# Patient Record
Sex: Male | Born: 1995
Health system: Southern US, Community
[De-identification: ages and names within clinical notes are randomized; demographics above are authoritative.]

## PROBLEM LIST (undated history)

## (undated) DIAGNOSIS — J45909 Unspecified asthma, uncomplicated: Secondary | ICD-10-CM

## (undated) DIAGNOSIS — H6690 Otitis media, unspecified, unspecified ear: Secondary | ICD-10-CM

## (undated) HISTORY — PX: WRIST ARTHROSCOPY: SHX838

## (undated) HISTORY — PX: WISDOM TOOTH EXTRACTION: SHX21

---
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
## 2017-07-01 ENCOUNTER — Other Ambulatory Visit: Payer: Self-pay | Admitting: Family Medicine

## 2017-07-01 DIAGNOSIS — N5089 Other specified disorders of the male genital organs: Secondary | ICD-10-CM

## 2017-07-07 ENCOUNTER — Ambulatory Visit
Admission: RE | Admit: 2017-07-07 | Discharge: 2017-07-07 | Disposition: A | Discharge status: Home / Self Care | Payer: 59 | Source: Ambulatory Visit | Attending: Family Medicine | Admitting: Family Medicine

## 2017-07-07 DIAGNOSIS — N5089 Other specified disorders of the male genital organs: Secondary | ICD-10-CM

## 2018-05-03 ENCOUNTER — Encounter (HOSPITAL_COMMUNITY): Payer: Self-pay | Admitting: Emergency Medicine

## 2018-05-03 ENCOUNTER — Ambulatory Visit (HOSPITAL_COMMUNITY)
Admission: EM | Admit: 2018-05-03 | Discharge: 2018-05-03 | Disposition: A | Discharge status: Home / Self Care | Payer: 59 | Attending: Family Medicine | Admitting: Family Medicine

## 2018-05-03 ENCOUNTER — Other Ambulatory Visit: Payer: Self-pay

## 2018-05-03 DIAGNOSIS — G43009 Migraine without aura, not intractable, without status migrainosus: Secondary | ICD-10-CM | POA: Diagnosis not present

## 2018-05-03 DIAGNOSIS — R110 Nausea: Secondary | ICD-10-CM | POA: Diagnosis not present

## 2018-05-03 HISTORY — DX: Unspecified asthma, uncomplicated: J45.909

## 2018-05-03 MED ORDER — DIPHENHYDRAMINE HCL 25 MG PO TABS: 25.0000 mg | ORAL_TABLET | Freq: Four times a day (QID) | ORAL | 0 refills | Status: DC

## 2018-05-03 MED ORDER — ONDANSETRON 4 MG PO TBDP
ORAL_TABLET | ORAL | Status: AC
  Filled 2018-05-03: qty 1

## 2018-05-03 MED ORDER — KETOROLAC TROMETHAMINE 60 MG/2ML IM SOLN
60.0000 mg | Freq: Once | INTRAMUSCULAR | Status: AC
Start: 2018-05-03 — End: 2018-05-03
  Administered 2018-05-03: 60 mg via INTRAMUSCULAR

## 2018-05-03 MED ORDER — ONDANSETRON 4 MG PO TBDP
4.0000 mg | ORAL_TABLET | Freq: Once | ORAL | Status: AC
  Administered 2018-05-03: 4 mg via ORAL

## 2018-05-03 MED ORDER — KETOROLAC TROMETHAMINE 60 MG/2ML IM SOLN
INTRAMUSCULAR | Status: AC
  Filled 2018-05-03: qty 2

## 2018-05-03 NOTE — ED Triage Notes (Signed)
Headache since Monday.  Patient has nausea, arms and legs tingling.  Patient has sensitivity to light.    Patient has a history of headaches

## 2018-05-03 NOTE — ED Provider Notes (Signed)
[facility]    CSN: [id number] Arrival date & time: 05/03/18  1314     History   Chief Complaint Chief Complaint  Patient presents with  . Headache    HPI Robert Galloway is a 22 y.o. male.   Robert Galloway presents with complaints of headache which started 11/11. Has been waxing and waning. Certain movements can make it worse, certain light can worsen it. Has associate nausea and decreased appetite. States he has had a few times of his right arm and leg feeling tingling. Denies this currently. No weakness. No numbness. Left side of head and behind eye throb. Pain currently 4/10. No dizziness. He feels slightly queasy. Has had similar headache in the past but typically doesn't last this long. Has tried ibuprofen and tylenol which haven't helped, last was yesterday. No head injury. No vision loss or change. Without contributing medical history.      ROS per HPI.      Past Medical History:  Diagnosis Date  . Asthma     There are no active problems to display for this patient.   Past Surgical History:  Procedure Laterality Date  . WRIST ARTHROSCOPY Right        Home Medications    Prior to Admission medications   Medication Sig Start Date End Date Taking? Authorizing Provider  fexofenadine (ALLEGRA) 60 MG tablet Take 60 mg by mouth 2 (two) times daily.   Yes [provider]  diphenhydrAMINE (BENADRYL) 25 MG tablet Take 1 tablet (25 mg total) by mouth every 6 (six) hours. 05/03/18   Georgetta HaberBurky, Cristina Ceniceros B, NP    Family History Family History  Problem Relation Age of Onset  . Healthy Mother     Social History Social History   Tobacco Use  . Smoking status: Never Smoker  Substance Use Topics  . Alcohol use: Yes  . Drug use: Yes    Types: Marijuana     Allergies   Augmentin [amoxicillin-pot clavulanate] and Cefzil [cefprozil]   Review of Systems Review of Systems   Physical Exam Triage Vital Signs ED Triage Vitals  Enc Vitals Group   BP 05/03/18 1435 140/78     Pulse Rate 05/03/18 1435 69     Resp 05/03/18 1435 18     Temp 05/03/18 1435 97.9 F (36.6 C)     Temp Source 05/03/18 1435 Oral     SpO2 05/03/18 1435 100 %     Weight --      Height --      Head Circumference --      Peak Flow --      Pain Score 05/03/18 1431 4     Pain Loc --      Pain Edu? --      Excl. in GC? --    No data found.  Updated Vital Signs BP 140/78 (BP Location: Right Arm)   Pulse 69   Temp 97.9 F (36.6 C) (Oral)   Resp 18   SpO2 100%    Physical Exam  Constitutional: He is oriented to person, place, and time. He appears well-developed and well-nourished.  HENT:  Head: Normocephalic and atraumatic.  Eyes: Pupils are equal, round, and reactive to light. EOM are normal.  Neck: Normal range of motion.  Cardiovascular: Normal rate and regular rhythm.  Pulmonary/Chest: Effort normal and breath sounds normal.  Neurological: He is alert and oriented to person, place, and time. He has normal strength. He is not disoriented. No cranial nerve  deficit or sensory deficit. GCS eye subscore is 4. GCS verbal subscore is 5. GCS motor subscore is 6.  Skin: Skin is warm and dry.  Psychiatric: He has a normal mood and affect.     UC Treatments / Results  Labs (all labs ordered are listed, but only abnormal results are displayed) Labs Reviewed - No data to display  EKG None  Radiology No results found.  Procedures Procedures (including critical care time)  Medications Ordered in UC Medications  ketorolac (TORADOL) injection 60 mg (has no administration in time range)  ondansetron (ZOFRAN-ODT) disintegrating tablet 4 mg (has no administration in time range)    Initial Impression / Assessment and Plan / UC Course  I have reviewed the triage vital signs and the nursing notes.  Pertinent labs & imaging results that were available during my care of the patient were reviewed by me and considered in my medical decision making (see  chart for details).     No red flag findings. Normal neurological exam today. Waxing and waning of headache, feels similar to previous headaches. toradol and zofran in clinic today. Encouraged to take benadryl once at home, rest, low stimulus. Return precautions provided. If symptoms worsen or do not improve in the next week to return to be seen or to follow up with PCP.  Patient verbalized understanding and agreeable to plan. Ambulatory out of clinic without difficulty.    Final Clinical Impressions(s) / UC Diagnoses   Final diagnoses:  Migraine without aura and without status migrainosus, not intractable     Discharge Instructions     Go home and take a benadryl and sleep. Low stimulus until improvement of symptoms.  Ensure you drink plenty of water to stay hydrated.  May take additional tylenol as needed. Don't take additional ibuprofen for another 8 hours.  If symptoms worsen or do not improve in the next week to return to be seen or to follow up with your PCP.     ED Prescriptions    Medication Sig Dispense Auth. Provider   diphenhydrAMINE (BENADRYL) 25 MG tablet Take 1 tablet (25 mg total) by mouth every 6 (six) hours. 20 tablet Georgetta Haber, NP     Controlled Substance Prescriptions [place] Controlled Substance Registry consulted? Not Applicable   Georgetta Haber, NP 05/03/18 1511

## 2018-05-03 NOTE — Discharge Instructions (Signed)
Go home and take a benadryl and sleep. Low stimulus until improvement of symptoms.  Ensure you drink plenty of water to stay hydrated.  May take additional tylenol as needed. Don't take additional ibuprofen for another 8 hours.  If symptoms worsen or do not improve in the next week to return to be seen or to follow up with your PCP.

## 2018-09-26 IMAGING — US US SCROTUM W/ DOPPLER COMPLETE
1 series · 14 of 25 positions shown · non-contrast
Comparison: None.

CLINICAL DATA: Testicular lump on the left, 2 months duration.

EXAM:
SCROTAL ULTRASOUND
DOPPLER ULTRASOUND OF THE TESTICLES
TECHNIQUE: Complete ultrasound examination of the testicles, epididymis, and
other scrotal structures was performed. Color and spectral Doppler
ultrasound were also utilized to evaluate blood flow to the
testicles.

[Series 1: us scrotum w/ doppler complete · 0.06mm/px · 14 of 55 slices shown]
[im 1/55]
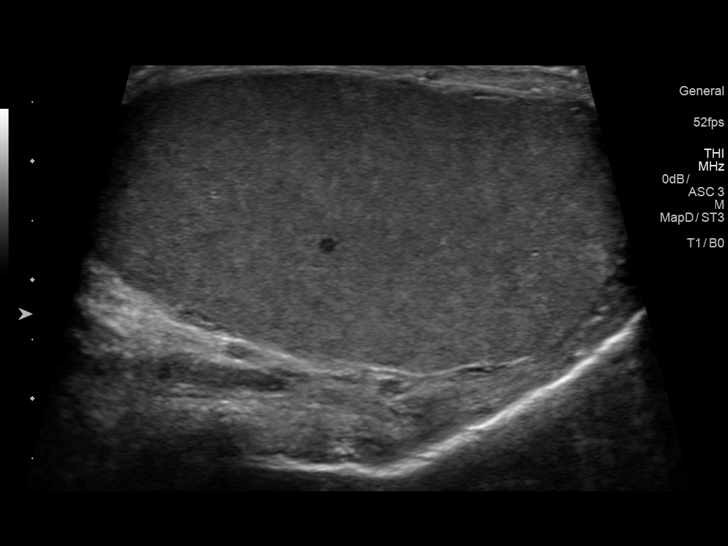
[im 5/55]
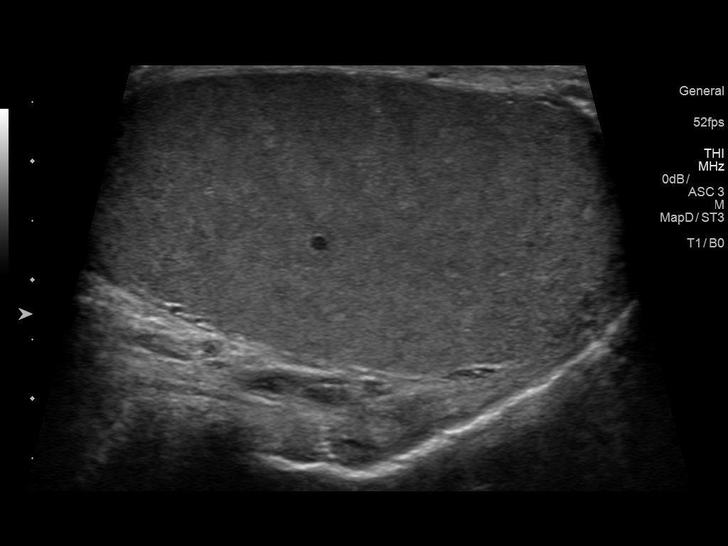
[im 10/55]
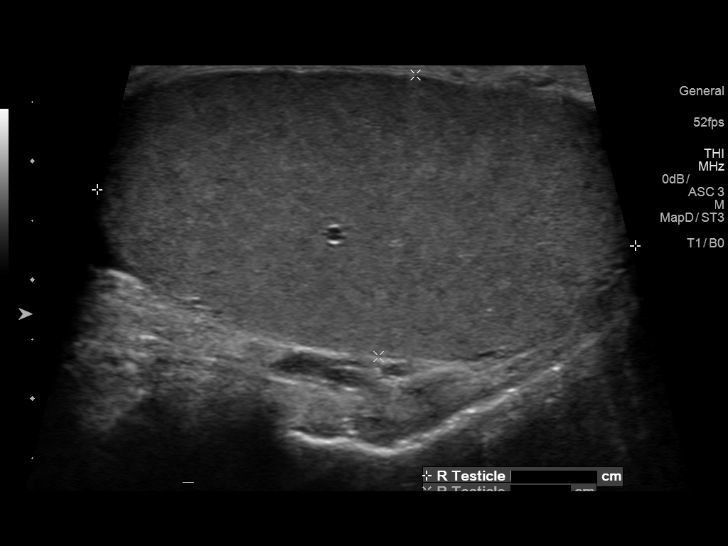
[im 14/55]
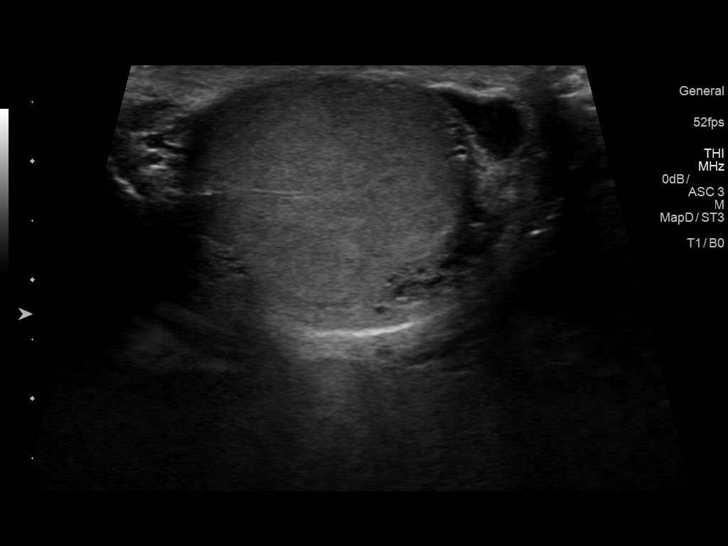
[im 19/55]
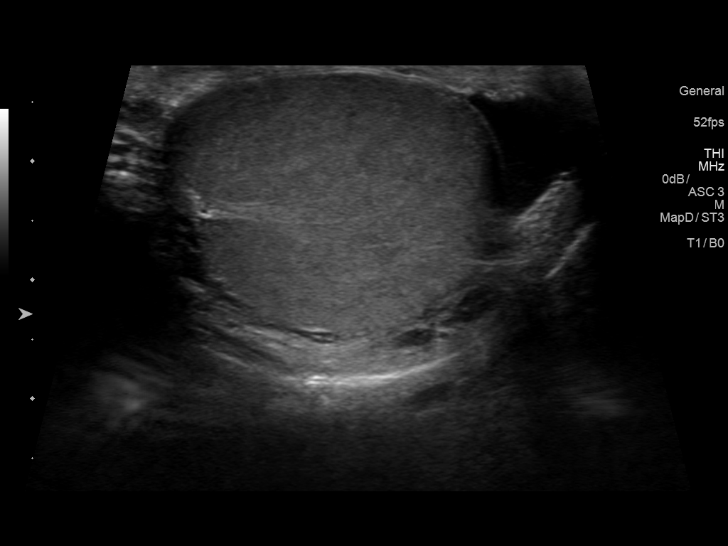
[im 21/55]
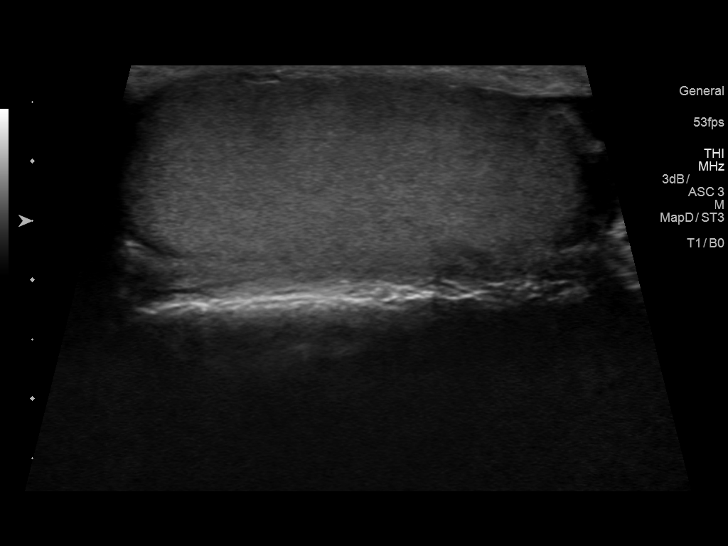
[im 25/55]
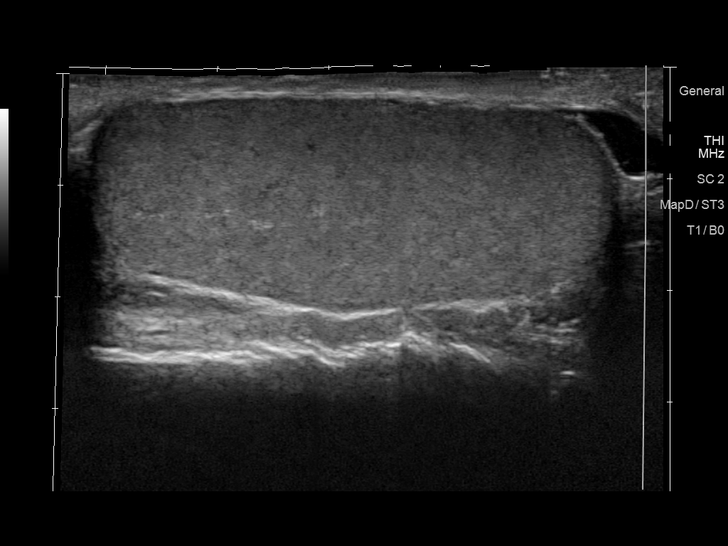
[im 30/55]
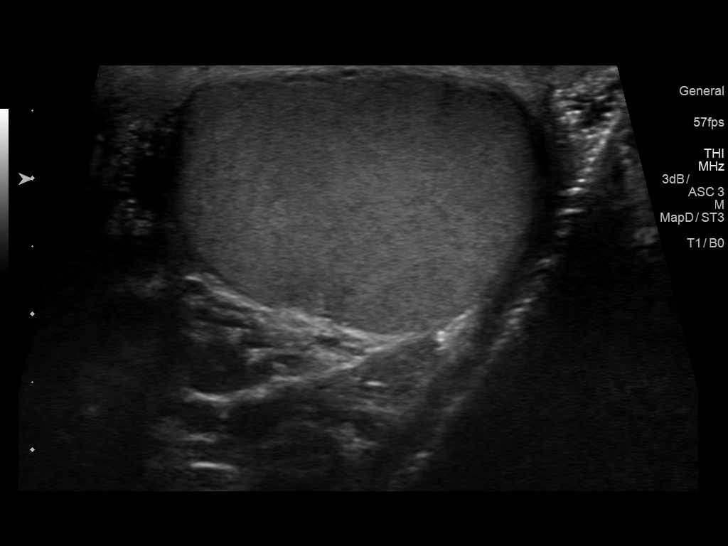
[im 34/55]
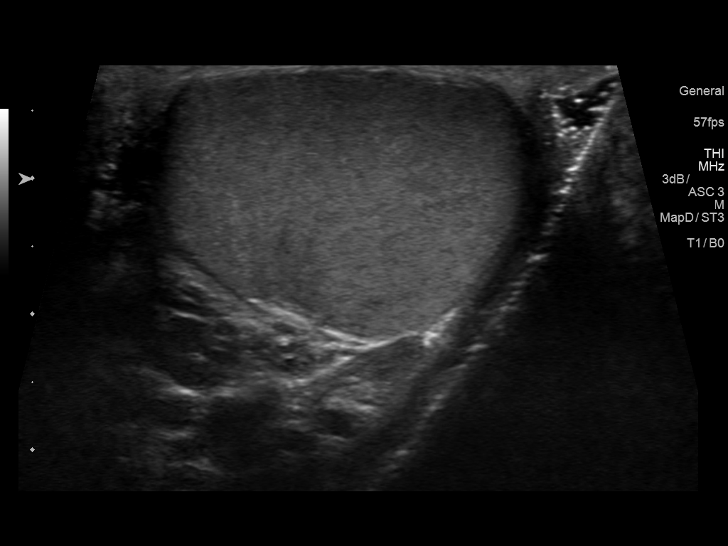
[im 37/55]
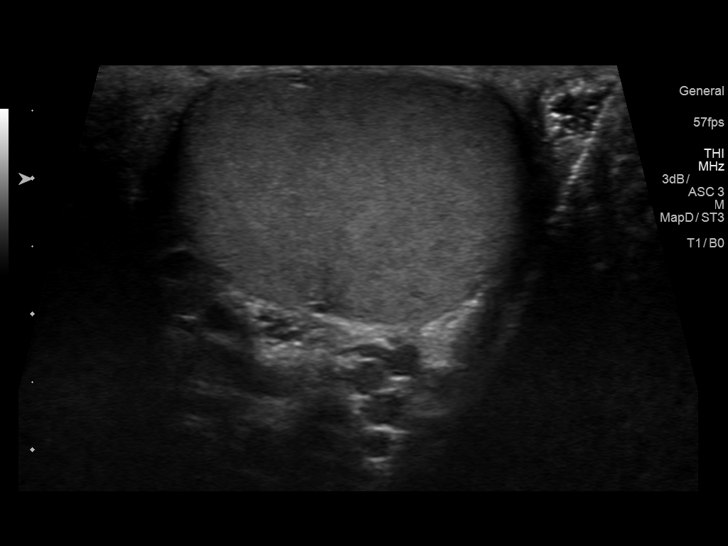
[im 41/55]
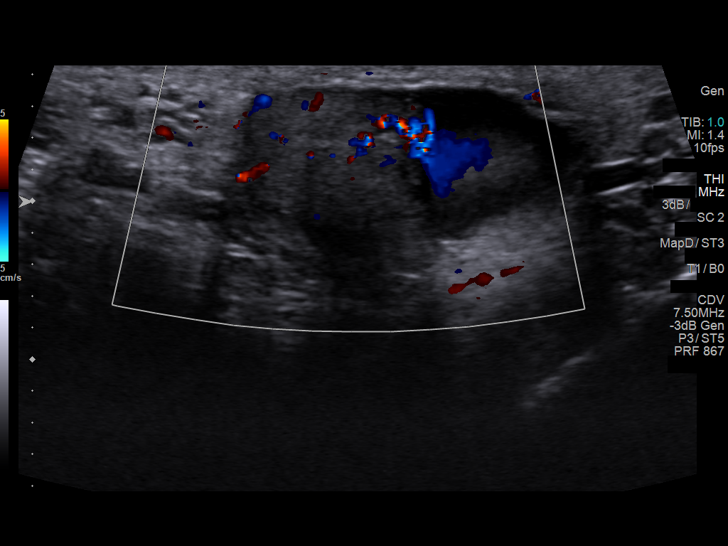
[im 46/55]
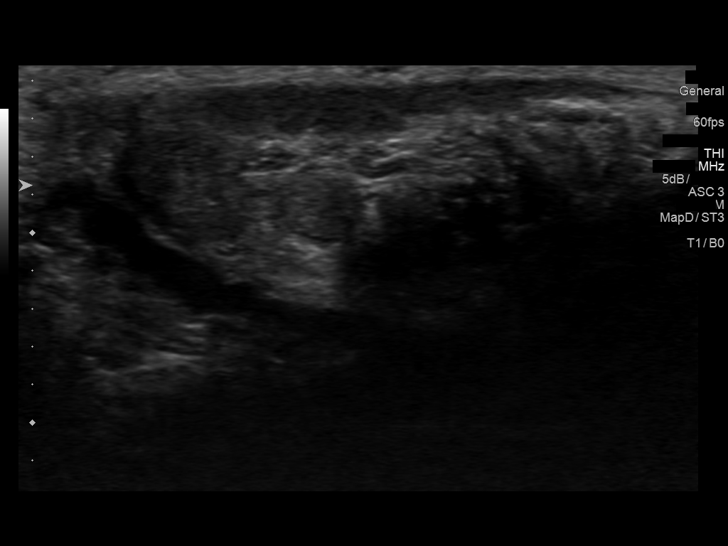
[im 50/55]
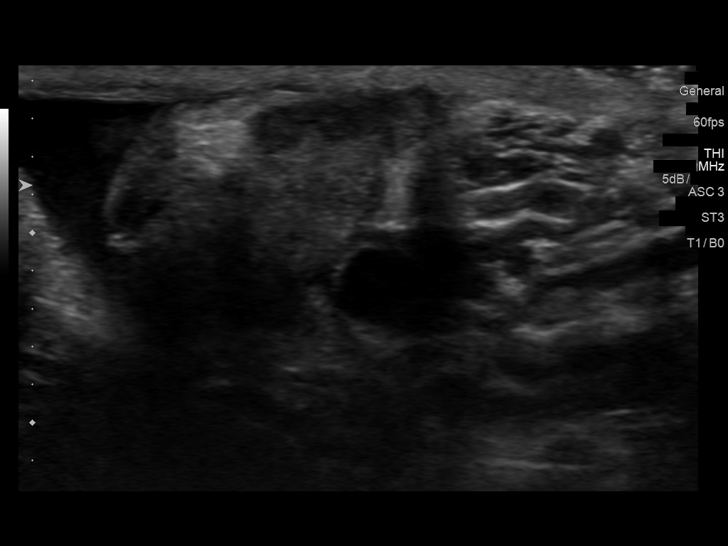
[im 55/55]
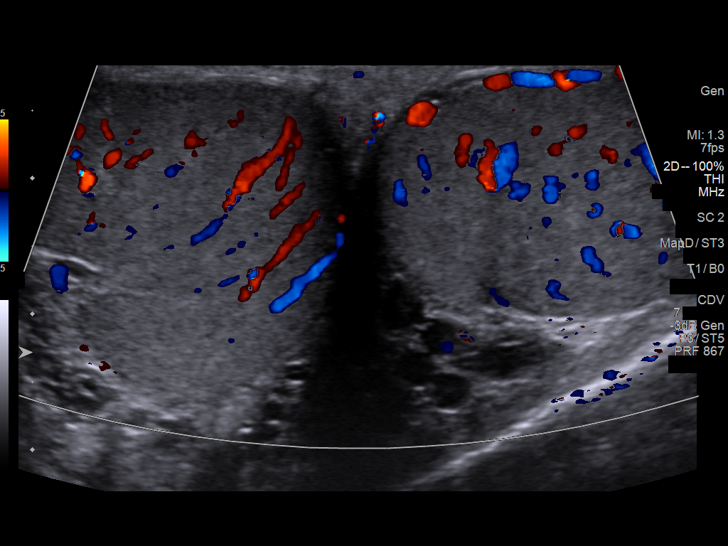

[14 of 25 positions shown; findings below may reference images not displayed]

FINDINGS: Right testicle

Measurements: 4.6 x 2.4 x 2.9 cm.. Normal appearance without mass.
Normal color flow pattern.

Left testicle

Measurements: 4.6 x 2.1 x 2.7 cm. Normal appearance without mass.
Normal color flow pattern.

Right epididymis:  Normal in size and appearance.

Left epididymis:  Normal in size and appearance.

Hydrocele:  Small right hydrocele.

Varicocele: Bilateral varicocele much more extensive on the left.
Perhaps this is what is palpable, as there does not appear to be any
other finding.

Pulsed Doppler interrogation of both testes demonstrates normal low
resistance arterial and venous waveforms bilaterally.
IMPRESSION: No evidence of left testicular mass. Varicocele, more extensive on
the left than the right. This could be what is being felt.

## 2019-05-23 ENCOUNTER — Other Ambulatory Visit: Payer: Self-pay

## 2019-05-23 DIAGNOSIS — Z20822 Contact with and (suspected) exposure to covid-19: Secondary | ICD-10-CM

## 2019-05-26 LAB — NOVEL CORONAVIRUS, NAA: SARS-CoV-2, NAA: NOT DETECTED

## 2019-11-07 ENCOUNTER — Ambulatory Visit: Payer: 59 | Attending: Family

## 2019-11-07 DIAGNOSIS — Z23 Encounter for immunization: Secondary | ICD-10-CM

## 2019-11-07 NOTE — Progress Notes (Signed)
   Covid-19 Vaccination Clinic  Name:  LOWELL MAKARA    MRN: [id number] DOB: [birth year]  11/07/2019  Mr. Freeman was observed post Covid-19 immunization for 15 minutes without incident. He was provided with Vaccine Information Sheet and instruction to access the V-Safe system.   Mr. Kopf was instructed to call 911 with any severe reactions post vaccine: Marland Kitchen Difficulty breathing  . Swelling of face and throat  . A fast heartbeat  . A bad rash all over body  . Dizziness and weakness   Immunizations Administered    Name Date Dose VIS Date Route   Moderna COVID-19 Vaccine 11/07/2019  1:09 PM 0.5 mL 05/2019 Intramuscular   Manufacturer: Moderna   Lot: [id number]   NDC: 79444-619-01

## 2020-03-23 ENCOUNTER — Ambulatory Visit
Admission: RE | Admit: 2020-03-23 | Discharge: 2020-03-23 | Disposition: A | Discharge status: Home / Self Care | Payer: 59 | Source: Ambulatory Visit | Attending: Emergency Medicine | Admitting: Emergency Medicine

## 2020-03-23 ENCOUNTER — Other Ambulatory Visit: Payer: Self-pay

## 2020-03-23 VITALS — BP 155/89 | HR 110 | Temp 100.8°F | Resp 18

## 2020-03-23 DIAGNOSIS — J039 Acute tonsillitis, unspecified: Secondary | ICD-10-CM | POA: Insufficient documentation

## 2020-03-23 LAB — POCT RAPID STREP A (OFFICE): Rapid Strep A Screen: NEGATIVE

## 2020-03-23 MED ORDER — AZITHROMYCIN 250 MG PO TABS: 250.0000 mg | ORAL_TABLET | Freq: Every day | ORAL | 0 refills | Status: DC

## 2020-03-23 NOTE — ED Triage Notes (Signed)
Pt reports sore R tonsil and swollen/tender R lymph node x 1 day.  Fever at home.  Took OTC meds yesterday with some relief.  No meds today.  Slight HA yesterday but states he has hx of headaches.

## 2020-03-23 NOTE — ED Provider Notes (Signed)
[facility]    CSN: [id number] Arrival date & time: 03/23/20  1049      History   Chief Complaint Chief Complaint  Patient presents with  . Sore Throat    HPI Robert Galloway is a 24 y.o. male  Presenting for right-sided sore throat since yesterday.  Took OTC medications without significant relief.  Denies nasal congestion, ear pain, difficulty breathing or swelling, chest pain, palpitations, cough.  No known sick contacts.  Denies abdominal pain, nausea, vomiting, arthralgias, myalgias.  Past Medical History:  Diagnosis Date  . Asthma     There are no problems to display for this patient.   Past Surgical History:  Procedure Laterality Date  . WISDOM TOOTH EXTRACTION    . WRIST ARTHROSCOPY Right        Home Medications    Prior to Admission medications   Medication Sig Start Date End Date Taking? Authorizing Provider  albuterol (VENTOLIN HFA) 108 (90 Base) MCG/ACT inhaler Inhale into the lungs every 6 (six) hours as needed for wheezing or shortness of breath.   Yes [provider]  fexofenadine (ALLEGRA) 60 MG tablet Take 60 mg by mouth 2 (two) times daily.   Yes [provider]  azithromycin (ZITHROMAX) 250 MG tablet Take 1 tablet (250 mg total) by mouth daily. Take first 2 tablets together, then 1 every day until finished. 03/23/20   Hall-Potvin, [place], PA-C  diphenhydrAMINE (BENADRYL) 25 MG tablet Take 1 tablet (25 mg total) by mouth every 6 (six) hours. 05/03/18   Georgetta Haber, NP    Family History Family History  Problem Relation Age of Onset  . Healthy Mother   . Hyperlipidemia Father   . Hypertension Father     Social History Social History   Tobacco Use  . Smoking status: Never Smoker  . Smokeless tobacco: Never Used  Vaping Use  . Vaping Use: Former  Substance Use Topics  . Alcohol use: Yes    Comment: occasional   . Drug use: Yes    Types: Marijuana     Allergies   Augmentin [amoxicillin-pot  clavulanate] and Cefzil [cefprozil]   Review of Systems As pe rHPI   Physical Exam Triage Vital Signs ED Triage Vitals  Enc Vitals Group     BP      Pulse      Resp      Temp      Temp src      SpO2      Weight      Height      Head Circumference      Peak Flow      Pain Score      Pain Loc      Pain Edu?      Excl. in GC?    No data found.  Updated Vital Signs BP (!) 155/89 (BP Location: Left Arm)   Pulse (!) 110   Temp (!) 100.8 F (38.2 C) (Oral)   Resp 18   SpO2 97%   Visual Acuity Right Eye Distance:   Left Eye Distance:   Bilateral Distance:    Right Eye Near:   Left Eye Near:    Bilateral Near:     Physical Exam HENT:     Mouth/Throat:     Tonsils: No tonsillar exudate. 3+ on the right. 1+ on the left.      UC Treatments / Results  Labs (all labs ordered are listed, but only abnormal results are  displayed) Labs Reviewed  NOVEL CORONAVIRUS, NAA  CULTURE, GROUP A STREP [facility])  POCT RAPID STREP A (OFFICE)    EKG   Radiology No results found.  Procedures Procedures (including critical care time)  Medications Ordered in UC Medications - No data to display  Initial Impression / Assessment and Plan / UC Course  I have reviewed the triage vital signs and the nursing notes.  Pertinent labs & imaging results that were available during my care of the patient were reviewed by me and considered in my medical decision making (see chart for details).     Patient febrile, nontoxic.  No airway compromise.  Rapid strep negative, culture pending.  Covid PCR pending will cover for tonsillitis as outlined below: Azithromycin as patient endorses systemic rash to Augmentin.  Return precautions discussed, pt verbalized understanding and is agreeable to plan. Final Clinical Impressions(s) / UC Diagnoses   Final diagnoses:  Acute tonsillitis, unspecified etiology     Discharge Instructions     Your rapid strep test was negative today.  The  culture is pending.  Please look on your MyChart for test results.   We will notify you if the culture positive and outline a treatment plan at that time.   Please continue Tylenol and/or Ibuprofen as needed for fever, pain.  May try warm salt water gargles, cepacol lozenges, throat spray, warm tea or water with lemon/honey, or OTC cold relief medicine for throat discomfort.   For congestion: take a daily anti-histamine like Zyrtec, Claritin, and a oral decongestant to help with post nasal drip that may be irritating your throat.   It is important to stay hydrated: drink plenty of fluids (primarily water) to keep your throat moisturized and help further relieve irritation/discomfort.     ED Prescriptions    Medication Sig Dispense Auth. Provider   azithromycin (ZITHROMAX) 250 MG tablet Take 1 tablet (250 mg total) by mouth daily. Take first 2 tablets together, then 1 every day until finished. 6 tablet Hall-Potvin, [place], PA-C     PDMP not reviewed this encounter.   Hall-Potvin, [place], [place] 03/23/20 1219

## 2020-03-23 NOTE — Discharge Instructions (Addendum)

## 2020-03-25 LAB — NOVEL CORONAVIRUS, NAA: SARS-CoV-2, NAA: NOT DETECTED

## 2020-03-25 LAB — CULTURE, GROUP A STREP (THRC)

## 2020-03-25 LAB — SARS-COV-2, NAA 2 DAY TAT

## 2020-09-06 ENCOUNTER — Encounter: Payer: Self-pay | Admitting: *Deleted

## 2020-09-06 ENCOUNTER — Other Ambulatory Visit: Payer: Self-pay

## 2020-09-06 ENCOUNTER — Ambulatory Visit
Admission: EM | Admit: 2020-09-06 | Discharge: 2020-09-06 | Disposition: A | Discharge status: Home / Self Care | Payer: 59 | Attending: Emergency Medicine | Admitting: Emergency Medicine

## 2020-09-06 DIAGNOSIS — J36 Peritonsillar abscess: Secondary | ICD-10-CM | POA: Diagnosis not present

## 2020-09-06 DIAGNOSIS — R509 Fever, unspecified: Secondary | ICD-10-CM | POA: Insufficient documentation

## 2020-09-06 HISTORY — DX: Otitis media, unspecified, unspecified ear: H66.90

## 2020-09-06 LAB — POCT RAPID STREP A (OFFICE): Rapid Strep A Screen: NEGATIVE

## 2020-09-06 MED ORDER — AZITHROMYCIN 250 MG PO TABS: 500.0000 mg | ORAL_TABLET | Freq: Every day | ORAL | 0 refills | Status: DC

## 2020-09-06 MED ORDER — IBUPROFEN 800 MG PO TABS: 800.0000 mg | ORAL_TABLET | Freq: Three times a day (TID) | ORAL | 0 refills | Status: AC

## 2020-09-06 NOTE — ED Triage Notes (Addendum)
Reports right sided sore throat, ear pain, chills onset yesterday with progressive worsening.  Reports negative Covid test at home today.

## 2020-09-06 NOTE — ED Provider Notes (Signed)
[facility]    CSN: [id number] Arrival date & time: 09/06/20  1109      History   Chief Complaint Chief Complaint  Patient presents with  . Sore Throat  . Otalgia    HPI Robert Galloway is a 25 y.o. male history of asthma presenting today for evaluation of sore throat.  Reports sore throat ear pain and chills beginning yesterday and is progressively worsening.  Negative home Covid test.  Reports sore throat is only right-sided.  Has had subjective fevers.  Denies close sick contacts.  HPI  Past Medical History:  Diagnosis Date  . Asthma   . Otitis media     There are no problems to display for this patient.   Past Surgical History:  Procedure Laterality Date  . WISDOM TOOTH EXTRACTION    . WRIST ARTHROSCOPY Right        Home Medications    Prior to Admission medications   Medication Sig Start Date End Date Taking? Authorizing Provider  azithromycin (ZITHROMAX) 250 MG tablet Take 2 tablets (500 mg total) by mouth daily for 5 days. 09/06/20 09/11/20 Yes Chiyeko Ferre C, PA-C  fexofenadine (ALLEGRA) 60 MG tablet Take 60 mg by mouth 2 (two) times daily.   Yes [provider]  ibuprofen (ADVIL) 800 MG tablet Take 1 tablet (800 mg total) by mouth 3 (three) times daily. 09/06/20  Yes Katria Botts C, PA-C  Triamcinolone Acetonide (NASACORT AQ NA) Place into the nose.   Yes [provider]  UNKNOWN TO PATIENT Vitamins   Yes [provider]  albuterol (VENTOLIN HFA) 108 (90 Base) MCG/ACT inhaler Inhale into the lungs every 6 (six) hours as needed for wheezing or shortness of breath.    [provider]  diphenhydrAMINE (BENADRYL) 25 MG tablet Take 1 tablet (25 mg total) by mouth every 6 (six) hours. 05/03/18 09/06/20  Georgetta Haber, NP    Family History Family History  Problem Relation Age of Onset  . Healthy Mother   . Hyperlipidemia Father   . Hypertension Father     Social History Social History   Tobacco Use   . Smoking status: Never Smoker  . Smokeless tobacco: Never Used  Vaping Use  . Vaping Use: Former  Substance Use Topics  . Alcohol use: Yes    Comment: occasional   . Drug use: Yes    Types: Marijuana     Allergies   Augmentin [amoxicillin-pot clavulanate] and Cefzil [cefprozil]   Review of Systems Review of Systems  Constitutional: Negative for activity change, appetite change, chills, fatigue and fever.  HENT: Positive for rhinorrhea and sore throat. Negative for congestion, ear pain, sinus pressure and trouble swallowing.   Eyes: Negative for discharge and redness.  Respiratory: Negative for cough, chest tightness and shortness of breath.   Cardiovascular: Negative for chest pain.  Gastrointestinal: Negative for abdominal pain, diarrhea, nausea and vomiting.  Musculoskeletal: Negative for myalgias.  Skin: Negative for rash.  Neurological: Negative for dizziness, light-headedness and headaches.     Physical Exam Triage Vital Signs ED Triage Vitals  Enc Vitals Group     BP      Pulse      Resp      Temp      Temp src      SpO2      Weight      Height      Head Circumference      Peak Flow  Pain Score      Pain Loc      Pain Edu?      Excl. in GC?    No data found.  Updated Vital Signs BP 122/75   Pulse (!) 112   Temp 100.2 F (37.9 C) (Oral)   Resp 16   SpO2 98%   Visual Acuity Right Eye Distance:   Left Eye Distance:   Bilateral Distance:    Right Eye Near:   Left Eye Near:    Bilateral Near:     Physical Exam Vitals and nursing note reviewed.  Constitutional:      Appearance: He is well-developed.     Comments: No acute distress  HENT:     Head: Normocephalic and atraumatic.     Ears:     Comments: Bilateral ears without tenderness to palpation of external auricle, tragus and mastoid, EAC's without erythema or swelling, TM's with good bony landmarks and cone of light. Non erythematous.     Nose: Nose normal.     Mouth/Throat:      Comments: Oral mucosa pink and moist, right tonsillar enlargement with erythema, no exudate, uvula midline, no soft palate swelling, posterior pharynx patent and nonerythematous, no uvula deviation or swelling. Normal phonation. Eyes:     Conjunctiva/sclera: Conjunctivae normal.  Cardiovascular:     Rate and Rhythm: Normal rate.  Pulmonary:     Effort: Pulmonary effort is normal. No respiratory distress.     Comments: Breathing comfortably at rest, CTABL, no wheezing, rales or other adventitious sounds auscultated Abdominal:     General: There is no distension.  Musculoskeletal:        General: Normal range of motion.     Cervical back: Neck supple.  Skin:    General: Skin is warm and dry.  Neurological:     Mental Status: He is alert and oriented to person, place, and time.      UC Treatments / Results  Labs (all labs ordered are listed, but only abnormal results are displayed) Labs Reviewed  CULTURE, GROUP A STREP [facility])  POCT RAPID STREP A (OFFICE)    EKG   Radiology No results found.  Procedures Procedures (including critical care time)  Medications Ordered in UC Medications - No data to display  Initial Impression / Assessment and Plan / UC Course  I have reviewed the triage vital signs and the nursing notes.  Pertinent labs & imaging results that were available during my care of the patient were reviewed by me and considered in my medical decision making (see chart for details).     Patient with borderline fever and unilateral tonsillar swelling and pain, no obvious sign of abscess at this time, but opting to go ahead and place on antibiotics.  Has allergy to Augmentin and Cefzil, will defer penicillin/cephalosporins at this time, will proceed with azithromycin, anti-inflammatories for pain and swelling.  Discussed strict return precautions. Patient verbalized understanding and is agreeable with plan.  Final Clinical Impressions(s) / UC Diagnoses   Final  diagnoses:  Peritonsillitis  Fever, unspecified     Discharge Instructions     Tylenol and ibuprofen for pain and swelling/fever Azithromycin 2 tablets daily for 5 days Follow-up if not improving or worsening    ED Prescriptions    Medication Sig Dispense Auth. Provider   ibuprofen (ADVIL) 800 MG tablet Take 1 tablet (800 mg total) by mouth 3 (three) times daily. 21 tablet Heath Badon C, PA-C   azithromycin (ZITHROMAX) 250 MG  tablet Take 2 tablets (500 mg total) by mouth daily for 5 days. 10 tablet Jaimie Redditt, [place] C, PA-C     PDMP not reviewed this encounter.   Lew Dawes, [place] 09/06/20 1206

## 2020-09-06 NOTE — Discharge Instructions (Signed)
Tylenol and ibuprofen for pain and swelling/fever Azithromycin 2 tablets daily for 5 days Follow-up if not improving or worsening

## 2020-09-08 ENCOUNTER — Other Ambulatory Visit: Payer: Self-pay

## 2020-09-08 ENCOUNTER — Ambulatory Visit
Admission: EM | Admit: 2020-09-08 | Discharge: 2020-09-08 | Disposition: A | Discharge status: Home / Self Care | Payer: 59 | Attending: Emergency Medicine | Admitting: Emergency Medicine

## 2020-09-08 ENCOUNTER — Encounter: Payer: Self-pay | Admitting: Emergency Medicine

## 2020-09-08 DIAGNOSIS — J029 Acute pharyngitis, unspecified: Secondary | ICD-10-CM | POA: Diagnosis present

## 2020-09-08 LAB — CULTURE, GROUP A STREP (THRC)

## 2020-09-08 LAB — POCT MONO SCREEN (KUC): Mono, POC: NEGATIVE

## 2020-09-08 MED ORDER — DEXAMETHASONE SODIUM PHOSPHATE 10 MG/ML IJ SOLN
10.0000 mg | Freq: Once | INTRAMUSCULAR | Status: AC
  Administered 2020-09-08: 10 mg via INTRAMUSCULAR

## 2020-09-08 MED ORDER — CLINDAMYCIN HCL 300 MG PO CAPS: 300.0000 mg | ORAL_CAPSULE | Freq: Three times a day (TID) | ORAL | 0 refills | Status: AC

## 2020-09-08 NOTE — Discharge Instructions (Addendum)
Mono negative today.  Stop azithromycin.  Start clindamycin.  Added 1000 mg Tylenol to ibuprofen 3 times a day.  Dexamethasone will work for 3 days.  We will contact you if your HSV culture comes back positive.   Some people find salt water gargles and  Traditional Medic[employer]'s "Throat Coat" tea helpful. Take 5 mL of liquid Benadryl and 5 mL of Maalox. Mix it together, and then hold it in your mouth for as long as you can and then swallow. You may do this 4 times a day.    Go to www.goodrx.com to look up your medications. This will give you a list of where you can find your prescriptions at the most affordable prices. Or ask the pharmacist what the cash price is, or if they have any other discount programs available to help make your medication more affordable. This can be less expensive than what you would pay with insurance.

## 2020-09-08 NOTE — ED Triage Notes (Addendum)
Patient c/o sore throat, fever, headache, and bilateral ear pain x 5 days.   Patient states symptoms haven't improved since office visit on Sunday.   Patient endorses chills at home.   Patient was seen at this clinic on Sunday and reports culture results that came on Mychart today.   Patient was given azithromycin and ibuprofen with no relief of symptoms.

## 2020-09-08 NOTE — ED Provider Notes (Signed)
HPI  SUBJECTIVE:  Robert Galloway is a 25 y.o. male who presents with sore throat for 4 days.  States initially started on the right side and now is located on the left.  He reports bilateral exudates, fevers T-max 100.2, chills, sensation of his throat swelling shut, body aches, headaches, ear pain.  No muffled voice, neck stiffness, drooling, trismus, difficulty breathing.  No loss of sense of smell or taste, cough.  He reports nausea starting after taking azithromycin.  No vomiting, diarrhea, cough.  No rash anywhere.  He had a negative home Covid test 2 days ago.  No known Covid exposure.  He got the booster vaccine recently.  He took ibuprofen within 6 hours of evaluation.  He has tried ibuprofen, azithromycin.  Ibuprofen helps.  Symptoms are worse with eating.  Patient was seen here 2 days ago for right-sided sore throat, thought to have a peritonsillitis.  Was placed on azithromycin because he has allergies to Augmentin and Cefzil.  Sent home on NSAIDs. Throat culture grew out beta-hemolytic  strep, not group A.  He has a past medical history of asthma, COVID, recurrent otitis media.  No history of frequent strep, mono, HSV 1 and 2.  [id number], Clifton Custard, MD   Past Medical History:  Diagnosis Date  . Asthma   . Otitis media     Past Surgical History:  Procedure Laterality Date  . WISDOM TOOTH EXTRACTION    . WRIST ARTHROSCOPY Right     Family History  Problem Relation Age of Onset  . Healthy Mother   . Hyperlipidemia Father   . Hypertension Father     Social History   Tobacco Use  . Smoking status: Never Smoker  . Smokeless tobacco: Never Used  Vaping Use  . Vaping Use: Former  Substance Use Topics  . Alcohol use: Yes    Comment: occasional   . Drug use: Yes    Types: Marijuana    No current facility-administered medications for this encounter.  Current Outpatient Medications:  .  clindamycin (CLEOCIN) 300 MG capsule, Take 1 capsule (300 mg total) by mouth 3 (three)  times daily for 10 days., Disp: 30 capsule, Rfl: 0 .  albuterol (VENTOLIN HFA) 108 (90 Base) MCG/ACT inhaler, Inhale into the lungs every 6 (six) hours as needed for wheezing or shortness of breath., Disp: , Rfl:  .  fexofenadine (ALLEGRA) 60 MG tablet, Take 60 mg by mouth 2 (two) times daily., Disp: , Rfl:  .  ibuprofen (ADVIL) 800 MG tablet, Take 1 tablet (800 mg total) by mouth 3 (three) times daily., Disp: 21 tablet, Rfl: 0 .  Triamcinolone Acetonide (NASACORT AQ NA), Place into the nose., Disp: , Rfl:  .  UNKNOWN TO PATIENT, Vitamins, Disp: , Rfl:   Allergies  Allergen Reactions  . Augmentin [Amoxicillin-Pot Clavulanate] Rash  . Cefzil [Cefprozil] Rash     ROS  As noted in HPI.   Physical Exam  BP 129/71 (BP Location: Left Arm)   Pulse 82   Temp 99.3 F (37.4 C) (Oral)   Resp 16   SpO2 96%   Constitutional: Well developed, well nourished, no acute distress Eyes:  EOMI, conjunctiva normal bilaterally HENT: Normocephalic, atraumatic,mucus membranes moist.  TMs normal bilaterally.  Mild nasal congestion.  Erythematous, enlarged tonsils with extensive exudates/ulcerations.  Uvula midline.  No soft palate swelling.  No muffled voice, drooling, trismus. Neck: Positive anterior cervical lymphadenopathy. No posterior cervical lymphadenopathy Respiratory: Normal inspiratory effort Cardiovascular: Normal rate, no murmurs GI:  nondistended soft, nontender, no splenomegaly skin: No rash, skin intact Musculoskeletal: no deformities Neurologic: Alert & oriented x 3, no focal neuro deficits Psychiatric: Speech and behavior appropriate   ED Course   Medications  dexamethasone (DECADRON) injection 10 mg (10 mg Intramuscular Given 09/08/20 1503)    Orders Placed This Encounter  Procedures  . Hsv Culture And Typing    Standing Status:   Standing    Number of Occurrences:   1  . POCT mono screen    Standing Status:   Standing    Number of Occurrences:   1    Results for orders  placed or performed during the hospital encounter of 09/08/20 (from the past 24 hour(s))  POCT mono screen     Status: None   Collection Time: 09/08/20  3:08 PM  Result Value Ref Range   Mono, POC Negative Negative   No results found.  ED Clinical Impression  1. Exudative pharyngitis      ED Assessment/Plan  Previous records reviewed.  As noted in HPI.  Patient with an extensive exudative pharyngitis.  Will check mono because it is not responding to azithromycin.  However his culture grew out beta-hemolytic strep, so we will start on clindamycin since he is not responding to the azithromycin.  We discussed the risks of clindamycin use.  Add Tylenol to the ibuprofen 3-4 times a day, Benadryl/Maalox mixture.  Giving dexamethasone 10 mg IM x1 due to the extensive swelling.  No evidence of impending airway compromise.  Mono negative.  Will check HSV because his tonsils appear ulcerated.  There is no evidence of peritonsillar abscess, retropharyngeal abscess at this time.  Discussed labs,MDM, treatment plan, and plan for follow-up with patient. Discussed sn/sx that should prompt return to the ED. patient agrees with plan.   Meds ordered this encounter  Medications  . dexamethasone (DECADRON) injection 10 mg  . clindamycin (CLEOCIN) 300 MG capsule    Sig: Take 1 capsule (300 mg total) by mouth 3 (three) times daily for 10 days.    Dispense:  30 capsule    Refill:  0    *This clinic note was created using Scientist, clinical (histocompatibility and immunogenetics). Therefore, there may be occasional mistakes despite careful proofreading.   ?    Domenick Gong, MD 09/09/20 [phone]

## 2020-09-10 LAB — HSV CULTURE AND TYPING
